# Patient Record
Sex: Male | Born: 1950 | Race: White | Hispanic: No | Marital: Single | Smoking: Never smoker
Health system: Southern US, Community
[De-identification: ages and names within clinical notes are randomized; demographics above are authoritative.]

## PROBLEM LIST (undated history)

## (undated) DIAGNOSIS — I10 Essential (primary) hypertension: Secondary | ICD-10-CM

## (undated) DIAGNOSIS — E119 Type 2 diabetes mellitus without complications: Secondary | ICD-10-CM

## (undated) HISTORY — PX: ROTATOR CUFF REPAIR: SHX139

## (undated) HISTORY — PX: KNEE ARTHROSCOPY: SUR90

---

## 2008-06-29 ENCOUNTER — Encounter: Admission: RE | Admit: 2008-06-29 | Discharge: 2008-06-29 | Payer: Self-pay | Admitting: Orthopedic Surgery

## 2009-10-27 ENCOUNTER — Emergency Department (HOSPITAL_COMMUNITY): Admission: EM | Admit: 2009-10-27 | Discharge: 2009-10-27 | Payer: Self-pay | Admitting: Emergency Medicine

## 2009-10-28 ENCOUNTER — Observation Stay (HOSPITAL_COMMUNITY): Admission: EM | Admit: 2009-10-28 | Discharge: 2009-10-29 | Payer: Self-pay | Admitting: Emergency Medicine

## 2010-08-24 LAB — DIFFERENTIAL
Basophils Absolute: 0 10*3/uL (ref 0.0–0.1)
Basophils Absolute: 0 10*3/uL (ref 0.0–0.1)
Basophils Absolute: 0.1 10*3/uL (ref 0.0–0.1)
Basophils Relative: 0 % (ref 0–1)
Eosinophils Relative: 0 % (ref 0–5)
Eosinophils Relative: 1 % (ref 0–5)
Lymphocytes Relative: 11 % — ABNORMAL LOW (ref 12–46)
Lymphocytes Relative: 5 % — ABNORMAL LOW (ref 12–46)
Lymphs Abs: 2 10*3/uL (ref 0.7–4.0)
Monocytes Absolute: 0.8 10*3/uL (ref 0.1–1.0)
Monocytes Absolute: 0.9 10*3/uL (ref 0.1–1.0)
Monocytes Relative: 8 % (ref 3–12)
Neutro Abs: 12.7 10*3/uL — ABNORMAL HIGH (ref 1.7–7.7)
Neutro Abs: 8.3 10*3/uL — ABNORMAL HIGH (ref 1.7–7.7)
Neutro Abs: 9.2 10*3/uL — ABNORMAL HIGH (ref 1.7–7.7)
Neutrophils Relative %: 74 % (ref 43–77)

## 2010-08-24 LAB — CBC
HCT: 43.8 % (ref 39.0–52.0)
Hemoglobin: 13.8 g/dL (ref 13.0–17.0)
Hemoglobin: 14.5 g/dL (ref 13.0–17.0)
Hemoglobin: 14.6 g/dL (ref 13.0–17.0)
Hemoglobin: 14.6 g/dL (ref 13.0–17.0)
MCHC: 33.8 g/dL (ref 30.0–36.0)
MCV: 87.9 fL (ref 78.0–100.0)
MCV: 88.8 fL (ref 78.0–100.0)
RBC: 4.78 MIL/uL (ref 4.22–5.81)
RBC: 4.84 MIL/uL (ref 4.22–5.81)
RDW: 13.2 % (ref 11.5–15.5)

## 2010-08-24 LAB — HEMOGLOBIN A1C
Hgb A1c MFr Bld: 7.9 % — ABNORMAL HIGH (ref <5.7)
Mean Plasma Glucose: 180 mg/dL — ABNORMAL HIGH (ref <117)

## 2010-08-24 LAB — GLUCOSE, CAPILLARY
Glucose-Capillary: 329 mg/dL — ABNORMAL HIGH (ref 70–99)
Glucose-Capillary: 338 mg/dL — ABNORMAL HIGH (ref 70–99)
Glucose-Capillary: 406 mg/dL — ABNORMAL HIGH (ref 70–99)

## 2010-08-24 LAB — COMPREHENSIVE METABOLIC PANEL
AST: 15 U/L (ref 0–37)
Albumin: 3.7 g/dL (ref 3.5–5.2)
Albumin: 3.8 g/dL (ref 3.5–5.2)
Alkaline Phosphatase: 74 U/L (ref 39–117)
BUN: 10 mg/dL (ref 6–23)
BUN: 11 mg/dL (ref 6–23)
Calcium: 9.1 mg/dL (ref 8.4–10.5)
Chloride: 98 mEq/L (ref 96–112)
Creatinine, Ser: 0.88 mg/dL (ref 0.4–1.5)
GFR calc non Af Amer: >60 mL/min (ref >60)
Glucose, Bld: 229 mg/dL — ABNORMAL HIGH (ref 70–99)
Glucose, Bld: 274 mg/dL — ABNORMAL HIGH (ref 70–99)
Potassium: 4.1 mEq/L (ref 3.5–5.1)
Potassium: 4.3 mEq/L (ref 3.5–5.1)
Sodium: 134 mEq/L — ABNORMAL LOW (ref 135–145)
Total Protein: 6.9 g/dL (ref 6.0–8.3)
Total Protein: 7.1 g/dL (ref 6.0–8.3)

## 2010-08-24 LAB — UIFE/LIGHT CHAINS/TP QN, 24-HR UR
Alpha 1, Urine: DETECTED — AB
Free Kappa Lt Chains,Ur: 2.29 mg/dL — ABNORMAL HIGH (ref 0.04–1.51)
Gamma Globulin, Urine: DETECTED — AB

## 2010-08-24 LAB — C-REACTIVE PROTEIN: CRP: 13.2 mg/dL — ABNORMAL HIGH (ref <0.6)

## 2010-08-24 LAB — POCT I-STAT, CHEM 8
BUN: 23 mg/dL (ref 6–23)
Chloride: 103 mEq/L (ref 96–112)
Glucose, Bld: 187 mg/dL — ABNORMAL HIGH (ref 70–99)
Potassium: 4.4 mEq/L (ref 3.5–5.1)
Sodium: 136 mEq/L (ref 135–145)
TCO2: 26 mmol/L (ref 0–100)

## 2010-08-24 LAB — MRSA PCR SCREENING: MRSA by PCR: NEGATIVE

## 2010-08-24 LAB — C4 COMPLEMENT: Complement C4, Body Fluid: 27 mg/dL (ref 16–47)

## 2010-08-24 LAB — BASIC METABOLIC PANEL
CO2: 24 mEq/L (ref 19–32)
Calcium: 8.7 mg/dL (ref 8.4–10.5)
Creatinine, Ser: 0.91 mg/dL (ref 0.4–1.5)
Glucose, Bld: 314 mg/dL — ABNORMAL HIGH (ref 70–99)

## 2010-08-24 LAB — PROTEIN ELECTROPH W RFLX QUANT IMMUNOGLOBULINS: Alpha-2-Globulin: 12.7 % — ABNORMAL HIGH (ref 7.1–11.8)

## 2010-08-24 LAB — MAGNESIUM: Magnesium: 1.5 mg/dL (ref 1.5–2.5)

## 2010-08-24 LAB — TSH: TSH: 1.316 u[IU]/mL (ref 0.350–4.500)

## 2010-08-24 LAB — C3 COMPLEMENT: C3 Complement: 183 mg/dL (ref 88–201)

## 2010-08-24 LAB — PHOSPHORUS: Phosphorus: 2.3 mg/dL (ref 2.3–4.6)

## 2013-10-07 ENCOUNTER — Encounter (HOSPITAL_COMMUNITY): Payer: Self-pay | Admitting: Emergency Medicine

## 2013-10-07 ENCOUNTER — Emergency Department (HOSPITAL_COMMUNITY)
Admission: EM | Admit: 2013-10-07 | Discharge: 2013-10-07 | Disposition: A | Discharge status: Home / Self Care | Payer: Commercial Managed Care - PPO | Attending: Emergency Medicine | Admitting: Emergency Medicine

## 2013-10-07 DIAGNOSIS — I10 Essential (primary) hypertension: Secondary | ICD-10-CM | POA: Insufficient documentation

## 2013-10-07 DIAGNOSIS — Y929 Unspecified place or not applicable: Secondary | ICD-10-CM | POA: Insufficient documentation

## 2013-10-07 DIAGNOSIS — W5911XA Bitten by nonvenomous snake, initial encounter: Secondary | ICD-10-CM | POA: Insufficient documentation

## 2013-10-07 DIAGNOSIS — E119 Type 2 diabetes mellitus without complications: Secondary | ICD-10-CM | POA: Insufficient documentation

## 2013-10-07 DIAGNOSIS — S61209A Unspecified open wound of unspecified finger without damage to nail, initial encounter: Secondary | ICD-10-CM | POA: Insufficient documentation

## 2013-10-07 DIAGNOSIS — W5901XA Bitten by nonvenomous lizards, initial encounter: Secondary | ICD-10-CM | POA: Insufficient documentation

## 2013-10-07 DIAGNOSIS — Y939 Activity, unspecified: Secondary | ICD-10-CM | POA: Insufficient documentation

## 2013-10-07 HISTORY — DX: Type 2 diabetes mellitus without complications: E11.9

## 2013-10-07 HISTORY — DX: Essential (primary) hypertension: I10

## 2013-10-07 LAB — CBC
HCT: 41.9 % (ref 39.0–52.0)
Hemoglobin: 14.6 g/dL (ref 13.0–17.0)
MCH: 29.4 pg (ref 26.0–34.0)
MCHC: 34.8 g/dL (ref 30.0–36.0)
MCV: 84.3 fL (ref 78.0–100.0)
PLATELETS: 211 10*3/uL (ref 150–400)
RBC: 4.97 MIL/uL (ref 4.22–5.81)
RDW: 13.3 % (ref 11.5–15.5)
WBC: 7.9 10*3/uL (ref 4.0–10.5)

## 2013-10-07 LAB — PROTIME-INR
INR: 1.03 (ref 0.00–1.49)
Prothrombin Time: 13.3 seconds (ref 11.6–15.2)

## 2013-10-07 LAB — FIBRINOGEN: Fibrinogen: 341 mg/dL (ref 204–475)

## 2013-10-07 LAB — D-DIMER, QUANTITATIVE (NOT AT ARMC)

## 2013-10-07 LAB — APTT: aPTT: 26 seconds (ref 24–37)

## 2013-10-07 MED ORDER — HYDROCODONE-ACETAMINOPHEN 5-325 MG PO TABS: 2.0000 | ORAL_TABLET | ORAL | Status: DC | PRN

## 2013-10-07 NOTE — ED Notes (Signed)
Pt was bit on the left pinky finger by baby copperhead.  One puncture hole noted to that finger.  Minor swelling noted.  No redness.  Happened at 0930.  Brought copperhead with him in a jar.

## 2013-10-07 NOTE — Discharge Instructions (Signed)
Snake Bite Snakes may be either venomous (containing poison) or nonvenomous (nonpoisonous). A nonvenomous snake bite will cause trauma or a wound to the skin and possibly the deeper tissues. A venomous snake will also cause a traumatic wound, but more importantly, it may have injected venom into the wound. Snake bite venom can be extremely serious and even deadly. One type of venom may cause major skin, tissue and muscle damage, and failure of normal blood clotting. This may cause extreme swelling and pain of the affected area. Another type of venom can affect the brain and nervous system and may cause death. The treatment for venomous snake bite may require the use of antivenom medicine. If you are unsure if your bite is from a venomous snake, you MUST seek immediate medical attention. YOU MIGHT NEED A TETANUS SHOT NOW IF:  You have no idea when you had the last one.  You have never had a tetanus shot before.  The bite broke your skin. If you need a tetanus shot, and you decide not to get one, there is a rare chance of getting tetanus. Sickness from tetanus can be serious. HOME CARE INSTRUCTIONS  A snake bit you and caused a skin wound. It may or may not have been venomous. If the snake was venomous, a small amount of venom may have been injected into your skin.  Keep the bite area clean and dry.  Keep the extremity elevated above the level of the heart for the next 48 hours.  Wash the bite area 3 times daily with soap and water or an antiseptic. Apply an adhesive or gauze bandage to the bite area.  If you develop blistering of any type at the site of the bite, protect the blisters from breaking. Do not attempt to open it.  If you were given a tetanus shot, your arm may get swollen, red and warm at the shot site. This is a common response to the injection. SEEK IMMEDIATE MEDICAL CARE IF:   You develop symptoms of poisoning including increased pain, redness, swelling, blood blisters or purple  spots in the bite area, nausea, vomiting, numbness, tingling, excessive sweating, breathing difficulty, blurred vision, feelings of lightheadedness, or feeling faint. If you develop symptoms of poisoning, you MUST seek immediate medical attention.  The bite becomes infected. Symptoms may include redness, swelling, pain, tenderness, pus, red streaks running from the wound, or an oral temperature above 102 F (38.9 C), not controlled by medicine.  Your condition or wound becomes worse. MAKE SURE YOU:   Understand these instructions.  Will watch your condition.  Will get help right away if you are not doing well or get worse. Document Released: 05/21/2000 Document Revised: 08/16/2011 Document Reviewed: 10/15/2009 [facility] Patient Information 2014 [employer] [place].

## 2013-10-07 NOTE — ED Provider Notes (Signed)
CSN: [id number]     Arrival date & time 10/07/13  1002 History   First MD Initiated Contact with Patient 10/07/13 1013     Chief Complaint  Patient presents with  . Snake Bite     (Consider location/radiation/quality/duration/timing/severity/associated sxs/prior Treatment) HPI Comments: Patient presents to the ER after being bitten on the left PT finger by a snake. The bite occurred 30 minutes before arrival. Patient reports mild to moderate pain at the site of the bite. He has brought the snake with him, has identified it as a copperhead.   Past Medical History  Diagnosis Date  . Diabetes mellitus without complication   . Hypertension    History reviewed. No pertinent past surgical history. History reviewed. No pertinent family history. History  Substance Use Topics  . Smoking status: Never Smoker   . Smokeless tobacco: Not on file  . Alcohol Use: No    Review of Systems  Skin: Positive for wound.  All other systems reviewed and are negative.     Allergies  Lisinopril  Home Medications   Prior to Admission medications   Not on File   BP 154/83  Pulse 80  Temp(Src) 97.7 F (36.5 C) (Oral)  Resp 18  SpO2 99% Physical Exam  Constitutional: He is oriented to person, place, and time. He appears well-developed and well-nourished. No distress.  HENT:  Head: Normocephalic and atraumatic.  Right Ear: Hearing normal.  Left Ear: Hearing normal.  Nose: Nose normal.  Mouth/Throat: Oropharynx is clear and moist and mucous membranes are normal.  Eyes: Conjunctivae and EOM are normal. Pupils are equal, round, and reactive to light.  Neck: Normal range of motion. Neck supple.  Cardiovascular: Regular rhythm, S1 normal and S2 normal.  Exam reveals no gallop and no friction rub.   No murmur heard. Pulmonary/Chest: Effort normal and breath sounds normal. No respiratory distress. He exhibits no tenderness.  Abdominal: Soft. Normal appearance and bowel sounds are normal. There  is no hepatosplenomegaly. There is no tenderness. There is no rebound, no guarding, no tenderness at McBurney's point and negative Murphy's sign. No hernia.  Musculoskeletal: Normal range of motion.  Neurological: He is alert and oriented to person, place, and time. He has normal strength. No cranial nerve deficit or sensory deficit. Coordination normal. GCS eye subscore is 4. GCS verbal subscore is 5. GCS motor subscore is 6.  Skin: Skin is warm, dry and intact. No rash noted. No cyanosis.     Psychiatric: He has a normal mood and affect. His speech is normal and behavior is normal. Thought content normal.    ED Course  Procedures (including critical care time) Labs Review Labs Reviewed  CBC  D-DIMER, QUANTITATIVE  PROTIME-INR  APTT  FIBRINOGEN    Imaging Review No results found.   EKG Interpretation None      MDM   Final diagnoses:  Snake bite    Patient presents to the ER for evaluation of snake bite. He did by mistake with him, it is confirmed as a copperhead. There was no swelling upon initial examination. He had a single small puncture wound. Care was performed in conjunction with recommendations by [place] poison control. He was observed for a period of 6 hours. There is no swelling or bruising. Lab work at 6 hours did not show any abnormalities that would be concerning after steak bite. It appears that this plate did not envonomate the patient. Patient given discharge instructions, pain control.  Gilda Creasehristopher J. Angeni Chaudhuri, MD 10/07/13  1537 

## 2013-10-07 NOTE — ED Notes (Signed)
Phone call complete with Beth from poison control.  Update provided. Reported that pt may go home after labs have been resulted and are WNL.  Will fax over poison control education for pt to take home.  Will f/u with pt on tomorrow via phone call.

## 2014-04-05 ENCOUNTER — Encounter: Payer: Commercial Managed Care - PPO | Admitting: Physician Assistant

## 2014-04-09 ENCOUNTER — Other Ambulatory Visit (HOSPITAL_COMMUNITY): Payer: Self-pay | Admitting: Occupational Medicine

## 2014-04-09 DIAGNOSIS — E785 Hyperlipidemia, unspecified: Secondary | ICD-10-CM

## 2014-04-09 DIAGNOSIS — E8881 Metabolic syndrome: Secondary | ICD-10-CM

## 2014-04-09 DIAGNOSIS — I158 Other secondary hypertension: Secondary | ICD-10-CM

## 2014-04-25 ENCOUNTER — Telehealth (HOSPITAL_COMMUNITY): Payer: Self-pay

## 2014-04-25 NOTE — Telephone Encounter (Signed)
Encounter complete. 

## 2014-04-30 ENCOUNTER — Ambulatory Visit (HOSPITAL_COMMUNITY)
Admission: RE | Admit: 2014-04-30 | Discharge: 2014-04-30 | Disposition: A | Discharge status: Home / Self Care | Payer: Commercial Managed Care - PPO | Source: Ambulatory Visit | Attending: Cardiovascular Disease | Admitting: Cardiovascular Disease

## 2014-04-30 DIAGNOSIS — E8881 Metabolic syndrome: Secondary | ICD-10-CM | POA: Diagnosis not present

## 2014-04-30 DIAGNOSIS — I272 Other secondary pulmonary hypertension: Secondary | ICD-10-CM | POA: Diagnosis not present

## 2014-04-30 DIAGNOSIS — E875 Hyperkalemia: Secondary | ICD-10-CM | POA: Insufficient documentation

## 2014-04-30 DIAGNOSIS — E785 Hyperlipidemia, unspecified: Secondary | ICD-10-CM | POA: Diagnosis present

## 2014-04-30 DIAGNOSIS — I158 Other secondary hypertension: Secondary | ICD-10-CM

## 2014-04-30 NOTE — Procedures (Signed)
Exercise Treadmill Test  Pre-Exercise Testing Evaluation NSR, normal tracing  Test  Exercise Tolerance Test Ordering MD: Maximino GreenlandMarc R. Corine ShelterWatkins MD  Interpreting MD:   Unique Test No: 1  Treadmill:  1  Indication for ETT: Metabolic Syndrome-Evaluate for Ischemia  Contraindication to ETT: No   Stress Modality: exercise - treadmill  Cardiac Imaging Performed: non   Protocol: standard Bruce - maximal  Max BP:  196/96  Max MPHR (bpm): 159 85% MPR (bpm): 135  MPHR obtained (bpm): 157 % MPHR obtained:  98  Reached 85% MPHR (min:sec): 3:55 Total Exercise Time (min-sec):  6:14  Workload in METS: 7.30 Borg Scale:   Reason ETT Terminated:  dyspnea    ST Segment Analysis At Rest: normal ST segments - no evidence of significant ST depression With Exercise: no evidence of significant ST depression  Other Information Arrhythmia:  rare PACs Angina during ETT:  absent (0) Quality of ETT:  diagnostic  ETT Interpretation:  normal - no evidence of ischemia by ST analysis  Comments: Fair exercise tolerance. Normal BP response to exercise  Thurmon FairMihai Lenwood Balsam, MD, [facility] AuthorityFACC [facility] [phone] office [phone] pager

## 2014-09-29 ENCOUNTER — Encounter (HOSPITAL_BASED_OUTPATIENT_CLINIC_OR_DEPARTMENT_OTHER): Payer: Commercial Managed Care - PPO

## 2014-12-18 ENCOUNTER — Other Ambulatory Visit (HOSPITAL_BASED_OUTPATIENT_CLINIC_OR_DEPARTMENT_OTHER): Payer: Self-pay | Admitting: General Practice

## 2014-12-18 ENCOUNTER — Ambulatory Visit (HOSPITAL_BASED_OUTPATIENT_CLINIC_OR_DEPARTMENT_OTHER)
Admission: RE | Admit: 2014-12-18 | Discharge: 2014-12-18 | Disposition: A | Discharge status: Home / Self Care | Payer: Commercial Managed Care - PPO | Source: Ambulatory Visit | Attending: General Practice | Admitting: General Practice

## 2014-12-18 DIAGNOSIS — M25561 Pain in right knee: Secondary | ICD-10-CM | POA: Diagnosis not present

## 2016-01-24 ENCOUNTER — Encounter (HOSPITAL_BASED_OUTPATIENT_CLINIC_OR_DEPARTMENT_OTHER): Payer: Self-pay | Admitting: *Deleted

## 2016-01-24 ENCOUNTER — Emergency Department (HOSPITAL_BASED_OUTPATIENT_CLINIC_OR_DEPARTMENT_OTHER): Payer: Commercial Managed Care - PPO

## 2016-01-24 ENCOUNTER — Emergency Department (HOSPITAL_BASED_OUTPATIENT_CLINIC_OR_DEPARTMENT_OTHER)
Admission: EM | Admit: 2016-01-24 | Discharge: 2016-01-24 | Disposition: A | Discharge status: Home / Self Care | Payer: Commercial Managed Care - PPO | Attending: Emergency Medicine | Admitting: Emergency Medicine

## 2016-01-24 DIAGNOSIS — Y999 Unspecified external cause status: Secondary | ICD-10-CM | POA: Insufficient documentation

## 2016-01-24 DIAGNOSIS — Z7982 Long term (current) use of aspirin: Secondary | ICD-10-CM | POA: Insufficient documentation

## 2016-01-24 DIAGNOSIS — Z23 Encounter for immunization: Secondary | ICD-10-CM | POA: Diagnosis not present

## 2016-01-24 DIAGNOSIS — Y9389 Activity, other specified: Secondary | ICD-10-CM | POA: Diagnosis not present

## 2016-01-24 DIAGNOSIS — I10 Essential (primary) hypertension: Secondary | ICD-10-CM | POA: Insufficient documentation

## 2016-01-24 DIAGNOSIS — Z7984 Long term (current) use of oral hypoglycemic drugs: Secondary | ICD-10-CM | POA: Diagnosis not present

## 2016-01-24 DIAGNOSIS — Z794 Long term (current) use of insulin: Secondary | ICD-10-CM | POA: Insufficient documentation

## 2016-01-24 DIAGNOSIS — Y92009 Unspecified place in unspecified non-institutional (private) residence as the place of occurrence of the external cause: Secondary | ICD-10-CM | POA: Diagnosis not present

## 2016-01-24 DIAGNOSIS — W010XXA Fall on same level from slipping, tripping and stumbling without subsequent striking against object, initial encounter: Secondary | ICD-10-CM | POA: Insufficient documentation

## 2016-01-24 DIAGNOSIS — Z79899 Other long term (current) drug therapy: Secondary | ICD-10-CM | POA: Insufficient documentation

## 2016-01-24 DIAGNOSIS — S62639B Displaced fracture of distal phalanx of unspecified finger, initial encounter for open fracture: Secondary | ICD-10-CM

## 2016-01-24 DIAGNOSIS — E119 Type 2 diabetes mellitus without complications: Secondary | ICD-10-CM | POA: Insufficient documentation

## 2016-01-24 DIAGNOSIS — IMO0002 Reserved for concepts with insufficient information to code with codable children: Secondary | ICD-10-CM

## 2016-01-24 DIAGNOSIS — W231XXA Caught, crushed, jammed, or pinched between stationary objects, initial encounter: Secondary | ICD-10-CM | POA: Insufficient documentation

## 2016-01-24 DIAGNOSIS — S62667B Nondisplaced fracture of distal phalanx of left little finger, initial encounter for open fracture: Secondary | ICD-10-CM | POA: Insufficient documentation

## 2016-01-24 DIAGNOSIS — S61217A Laceration without foreign body of left little finger without damage to nail, initial encounter: Secondary | ICD-10-CM | POA: Diagnosis present

## 2016-01-24 MED ORDER — IBUPROFEN 600 MG PO TABS: 600.0000 mg | ORAL_TABLET | Freq: Four times a day (QID) | ORAL | 0 refills | Status: AC | PRN

## 2016-01-24 MED ORDER — CEPHALEXIN 500 MG PO CAPS
500.0000 mg | ORAL_CAPSULE | Freq: Two times a day (BID) | ORAL | 0 refills | Status: AC
Start: 2016-01-24 — End: 2016-01-31

## 2016-01-24 MED ORDER — HYDROCODONE-ACETAMINOPHEN 5-325 MG PO TABS
1.0000 | ORAL_TABLET | Freq: Once | ORAL | Status: AC
  Administered 2016-01-24: 1 via ORAL
  Filled 2016-01-24: qty 1

## 2016-01-24 MED ORDER — TETANUS-DIPHTH-ACELL PERTUSSIS 5-2.5-18.5 LF-MCG/0.5 IM SUSP
0.5000 mL | Freq: Once | INTRAMUSCULAR | Status: AC
  Administered 2016-01-24: 0.5 mL via INTRAMUSCULAR
  Filled 2016-01-24: qty 0.5

## 2016-01-24 MED ORDER — BACITRACIN ZINC 500 UNIT/GM EX OINT
1.0000 "application " | TOPICAL_OINTMENT | Freq: Two times a day (BID) | CUTANEOUS | Status: DC
  Administered 2016-01-24: 1 via TOPICAL

## 2016-01-24 MED ORDER — HYDROCODONE-ACETAMINOPHEN 5-325 MG PO TABS: 1.0000 | ORAL_TABLET | ORAL | 0 refills | Status: AC | PRN

## 2016-01-24 MED ORDER — LIDOCAINE HCL (PF) 1 % IJ SOLN
5.0000 mL | Freq: Once | INTRAMUSCULAR | Status: AC
  Administered 2016-01-24: 5 mL
  Filled 2016-01-24: qty 5

## 2016-01-24 NOTE — ED Provider Notes (Signed)
[facility] Note   CSN: [id number] Arrival date & time: 01/24/16  1011     History   Chief Complaint Chief Complaint  Patient presents with  . Laceration    left pinky    HPI Hayden Martinez is a 65 y.o. male.  Patient is a 65 year old male with past medical history of diabetes and hypertension who presents the ED with complaint of laceration to left pinky finger, onset prior to arrival. Patient reports he was closing his bi-fold laundry doors at home when his left pinky finger got caught in the door as it was closing. Endorses tingling sensation to the tip of his pinky finger with associated laceration, bleeding controlled PTA. Denies use of anticoagulants. Tetanus status unknown. Denies taking any medications PTA.        Past Medical History:  Diagnosis Date  . Diabetes mellitus without complication (HCC)   . Hypertension     There are no active problems to display for this patient.   Past Surgical History:  Procedure Laterality Date  . KNEE ARTHROSCOPY Left   . ROTATOR CUFF REPAIR Left     OB History    No data available       Home Medications    Prior to Admission medications   Medication Sig Start Date End Date Taking? Authorizing Provider  aspirin EC 81 MG tablet Take 81 mg by mouth every morning.   Yes Historical Provider, MD  glipiZIDE (GLUCOTROL XL) 5 MG 24 hr tablet Take 5 mg by mouth at bedtime.   Yes Historical Provider, MD  insulin glargine (LANTUS) 100 UNIT/ML injection Inject 38 Units into the skin at bedtime.   Yes Historical Provider, MD  Boris Lown Oil 300 MG CAPS Take 1 capsule by mouth at bedtime.   Yes Historical Provider, MD  metFORMIN (GLUCOPHAGE-XR) 500 MG 24 hr tablet Take 500 mg by mouth 2 (two) times daily.   Yes Historical Provider, MD  Multiple Vitamin (MULTIVITAMIN WITH MINERALS) TABS tablet Take 1 tablet by mouth every morning.   Yes Historical Provider, MD  olmesartan (BENICAR) 20 MG tablet Take 20 mg by mouth every  morning.   Yes Historical Provider, MD  HYDROcodone-acetaminophen (NORCO/VICODIN) 5-325 MG per tablet Take 2 tablets by mouth every 4 (four) hours as needed for moderate pain. 10/07/13   Gilda Crease, MD  simvastatin (ZOCOR) 10 MG tablet Take 10 mg by mouth every morning.    Historical Provider, MD  sitaGLIPtin (JANUVIA) 25 MG tablet Take 25 mg by mouth every morning.    Historical Provider, MD    Family History No family history on file.  Social History Social History  Substance Use Topics  . Smoking status: Never Smoker  . Smokeless tobacco: Never Used  . Alcohol use Yes     Comment: occassionally     Allergies   Lisinopril   Review of Systems Review of Systems  Musculoskeletal: Positive for arthralgias (left pinky finger).  Skin: Positive for wound (laceration).  Neurological:       Tingling  All other systems reviewed and are negative.    Physical Exam Updated Vital Signs BP 153/100 (BP Location: Right Arm)   Temp 98.3 F (36.8 C) (Oral)   Resp 20   Ht 5\' 11"  (1.803 m)   Wt 117.9 kg   SpO2 100%   BMI 36.26 kg/m   Physical Exam  Constitutional: He is oriented to person, place, and time. He appears well-developed and well-nourished.  HENT:  Head:  Normocephalic and atraumatic.  Eyes: Conjunctivae and EOM are normal. Right eye exhibits no discharge. Left eye exhibits no discharge. No scleral icterus.  Neck: Normal range of motion. Neck supple.  Cardiovascular: Normal rate.   Pulmonary/Chest: Effort normal. No respiratory distress.  Abdominal: Soft. He exhibits no distension.  Musculoskeletal: Normal range of motion. He exhibits tenderness.       Left hand: He exhibits tenderness, laceration and swelling. He exhibits normal range of motion and normal capillary refill. Normal sensation noted. Normal strength noted.       Hands: FROM of left pinky finger at DIP, PIP and MCP joint with 5/5 strength. Sensation grossly intact. 2+ radial pulse. Cap refill <2.    Neurological: He is alert and oriented to person, place, and time.  Skin: Skin is warm and dry. Laceration noted.  1.5cm & 0.5cm lacerations noted to palmar aspect of distal phalanx of left pinky finger, no active bleeding. No visible bone or ligament, able to visualize subcutaneous fat.   Small subungual hematoma noted to distal 1/3 of left pinky nail. Nail grossly intact.  Nursing note and vitals reviewed.    ED Treatments / Results  Labs (all labs ordered are listed, but only abnormal results are displayed) Labs Reviewed - No data to display  EKG  EKG Interpretation None       Radiology No results found.  Procedures .Marland Kitchen.Laceration Repair Date/Time: 01/24/2016 12:16 PM Performed by: Barrett HenleNADEAU, NICOLE ELIZABETH Authorized by: Barrett HenleNADEAU, NICOLE ELIZABETH   Consent:    Consent obtained:  Verbal   Consent given by:  Patient Anesthesia (see MAR for exact dosages):    Anesthesia method:  Local infiltration   Local anesthetic:  Lidocaine 1% w/o epi Laceration details:    Location:  Finger   Finger location:  L small finger   Wound length (cm): 1.5cm & 0.5cm. Repair type:    Repair type:  Simple Pre-procedure details:    Preparation:  Patient was prepped and draped in usual sterile fashion Exploration:    Wound exploration: wound explored through full range of motion and entire depth of wound probed and visualized     Wound extent: no foreign bodies/material noted, no muscle damage noted, no nerve damage noted, no tendon damage noted and no vascular damage noted   Treatment:    Area cleansed with:  Betadine and saline   Amount of cleaning:  Standard   Irrigation solution:  Sterile water   Irrigation method:  Syringe Skin repair:    Repair method:  Sutures   Suture size:  4-0   Suture material:  Prolene   Suture technique:  Simple interrupted   Number of sutures:  7 Approximation:    Approximation:  Close Post-procedure details:    Dressing:  Antibiotic ointment and  sterile dressing   Patient tolerance of procedure:  Tolerated well, no immediate complications    (including critical care time)  Medications Ordered in ED Medications  lidocaine (PF) (XYLOCAINE) 1 % injection 5 mL (not administered)  Tdap (BOOSTRIX) injection 0.5 mL (not administered)  HYDROcodone-acetaminophen (NORCO/VICODIN) 5-325 MG per tablet 1 tablet (not administered)     Initial Impression / Assessment and Plan / ED Course  I have reviewed the triage vital signs and the nursing notes.  Pertinent labs & imaging results that were available during my care of the patient were reviewed by me and considered in my medical decision making (see chart for details).  Clinical Course    Patient presents with left pinky  finger injury that occurred this morning. Tetanus status unknown. Bleeding controlled prior to arrival. VSS. Exam revealed lacerations to palmar aspect of left pinky finger with subcutaneous fat visible, no active bleeding, left hand neurovascularly intact. Tetanus updated in the ED. Left little finger x-ray revealed nondisplaced fracture of distal tuft. Wound was copiously irrigated and sutures were placed without any complications. Patient was placed in anger splint with left fifth DIP in extension. Patient was given information to follow up with hand surgery in 2 days. Advised patient to return to the ED in 7 days for suture removal. Plan to discharge patient home with Keflex for open tuft fx, pain meds and symptomatic treatment. Discussed return precautions with patient.  Final Clinical Impressions(s) / ED Diagnoses   Final diagnoses:  Laceration    New Prescriptions New Prescriptions   No medications on file     Barrett Henleicole Elizabeth Nadeau, PA-C 01/24/16 1609    Linwood DibblesJon Knapp, MD 01/25/16 [phone]

## 2016-01-24 NOTE — Discharge Instructions (Signed)
Take your antibiotic as prescribed until he has completed the prescription. You may take your pain medications as prescribed as an for pain relief. I recommend applying ice to her finger for 15 minutes 3-4 times daily to help with pain and swelling. Keep the splint on your finger until you follow up with hand surgery. Call the hand surgery clinic listed above on Monday to schedule a follow-up appointment for further management and evaluation of your Open Tuft Fracture.  Please return to the Emergency Department if symptoms worsen or new onset of fever, redness, swelling, warmth, drainage, numbness, tingling, weakness.

## 2016-01-24 NOTE — ED Notes (Signed)
Patient complained of pain, and numbness in the forth and fifth finger due to the splint being too tight.  Reapplied splint the a metal splint that covers the tip of the finger to the left fifth pinky, cms intact, fourth finger numbness improved.

## 2016-01-24 NOTE — ED Triage Notes (Signed)
Patient states he was walking down his hallway and tripped over a laundry basket, causing him to fall into bifold doors.  Laceration to palm side of left fifth pinky finger.

## 2016-12-11 IMAGING — DX DG KNEE COMPLETE 4+V*R*
4 series · 5 of 5 positions shown · non-contrast
Comparison: None.

CLINICAL DATA: Right medial knee pain for 6 weeks, no known injury,
initial encounter

EXAM:
RIGHT KNEE - COMPLETE 4+ VIEW

[knee ap]
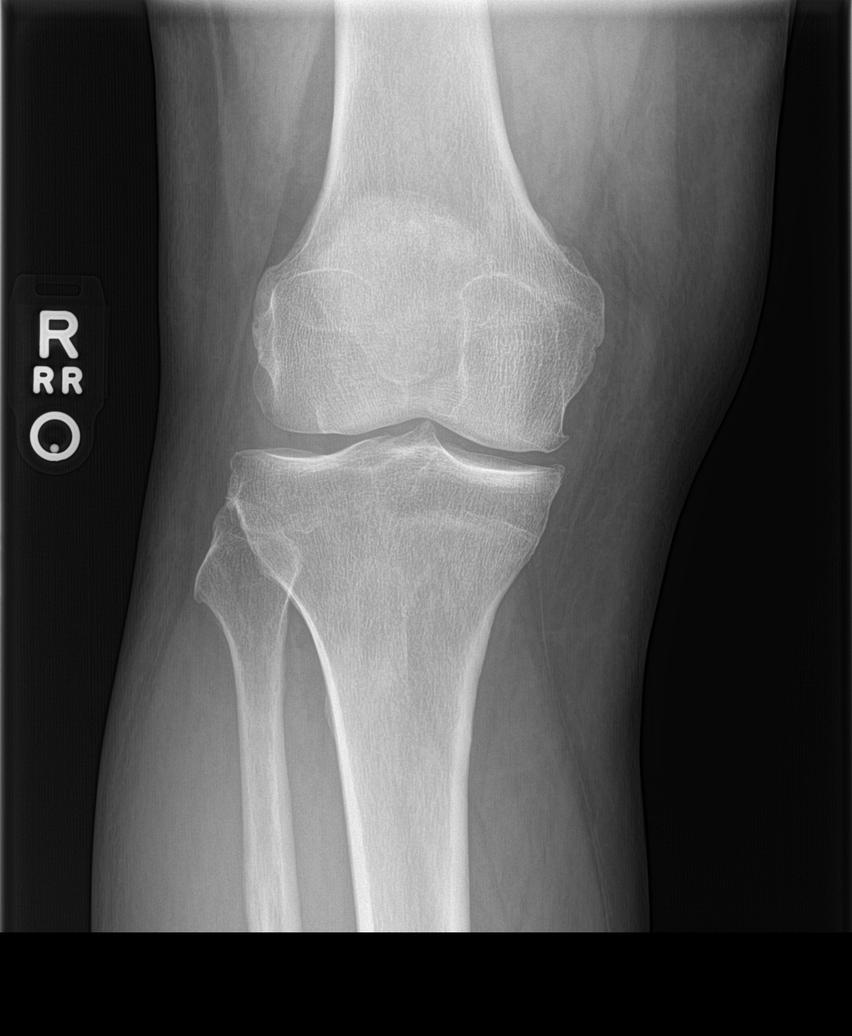

[Series 2: tunnel · 0.14mm/px · 2 of 2 slices shown]
[im 1/2]
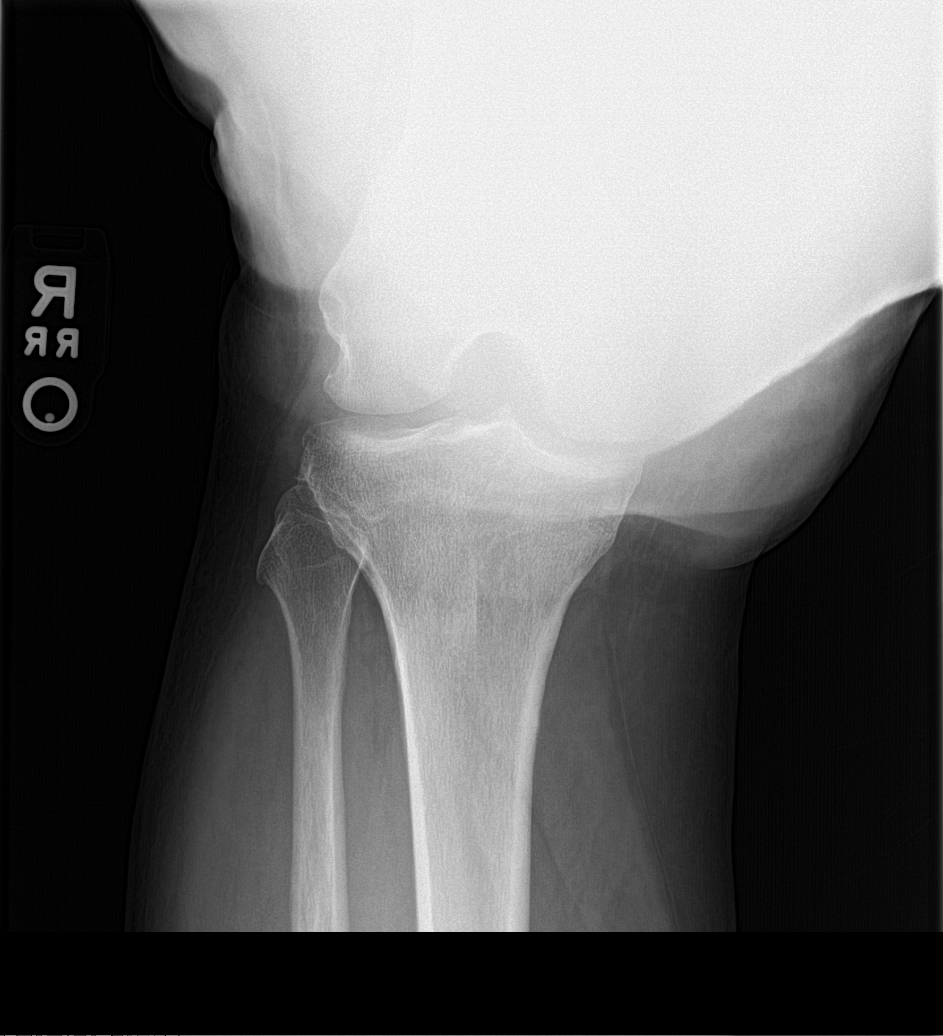
[im 2/2]
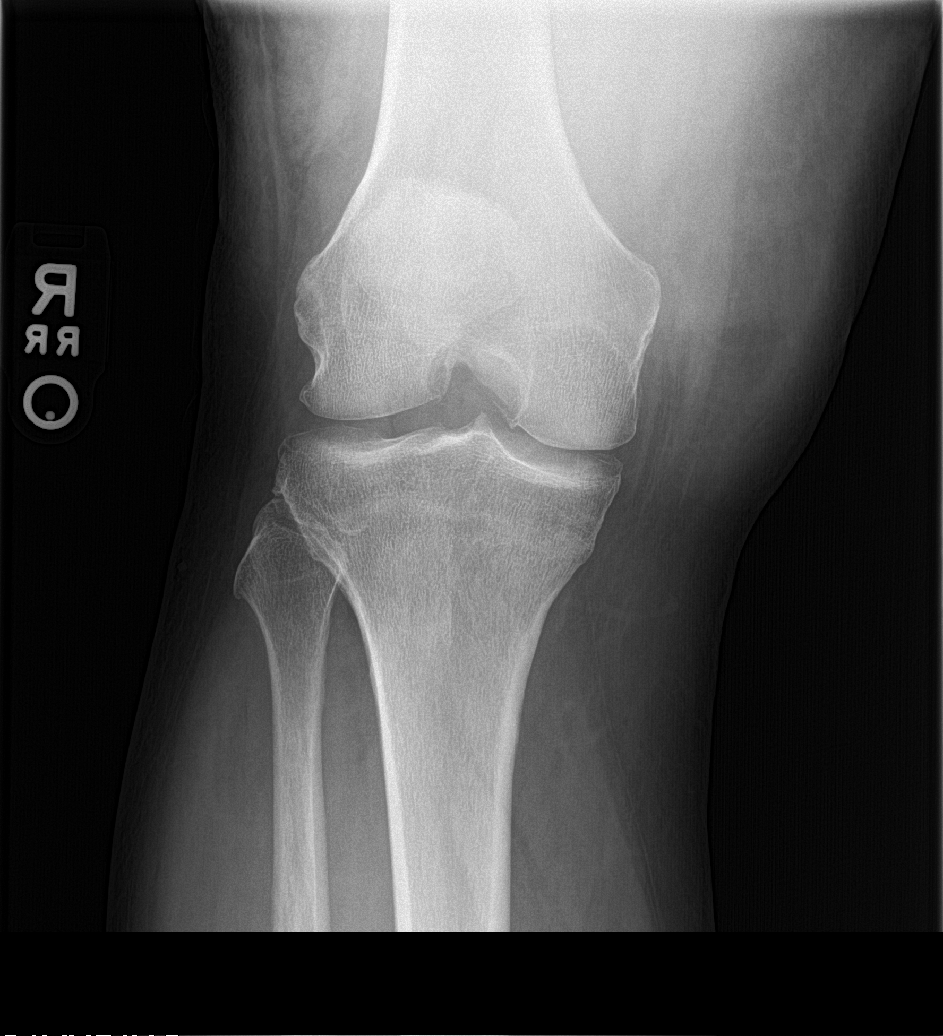

[knee lat]
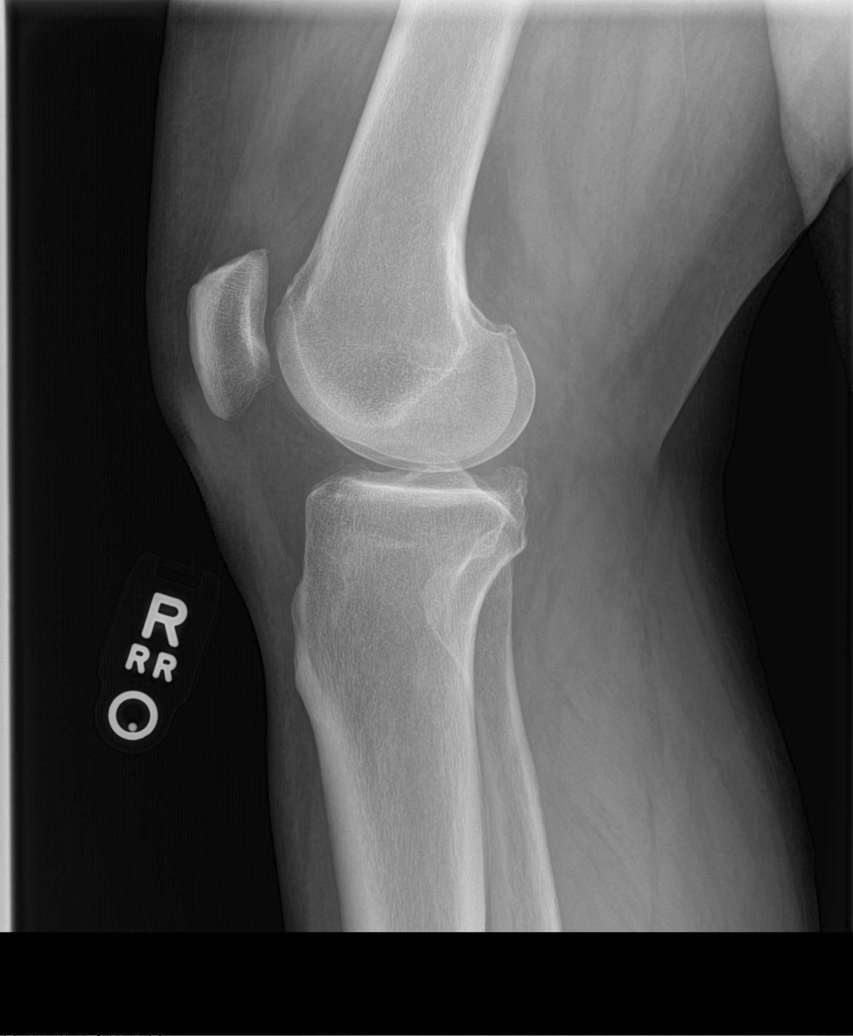

[knee sunrise]
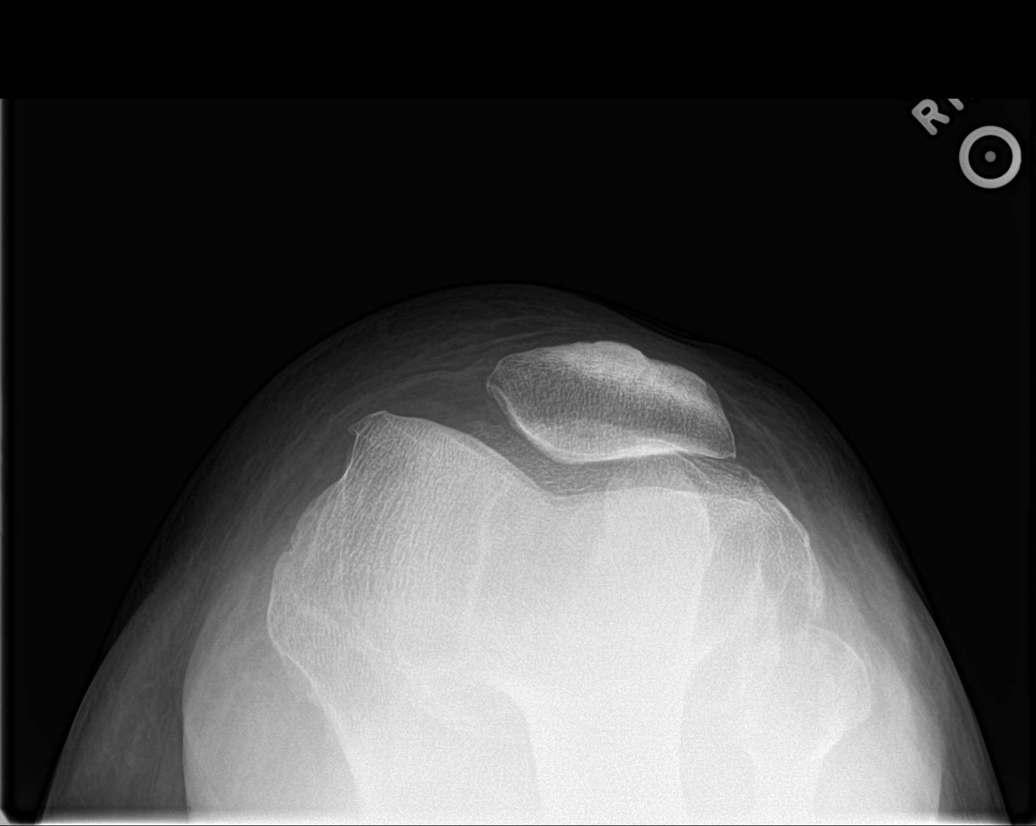

[5 of 5 positions shown; findings below may reference images not displayed]

FINDINGS: Medial joint space narrowing with osteophytic changes noted. No
acute fracture or dislocation is seen. No joint effusion is noted.
No other soft tissue changes are noted.
IMPRESSION: Mild degenerative change medially without acute abnormality.

## 2018-01-17 IMAGING — DX DG FINGER LITTLE 2+V*L*
3 series · 3 of 3 positions shown · non-contrast
Comparison: None.

CLINICAL DATA: Initial encounter. 64 y/o male with injury to LEFT
pinky finger from "bi-fold" door at home today, states it pinches
his finger. Two bleeding lacerations noted at distal end of finger,
on anterior side (where fingerprint is). Decent ROM, no prior
injury.

EXAM:
LEFT LITTLE FINGER 2+V

[finger ap]
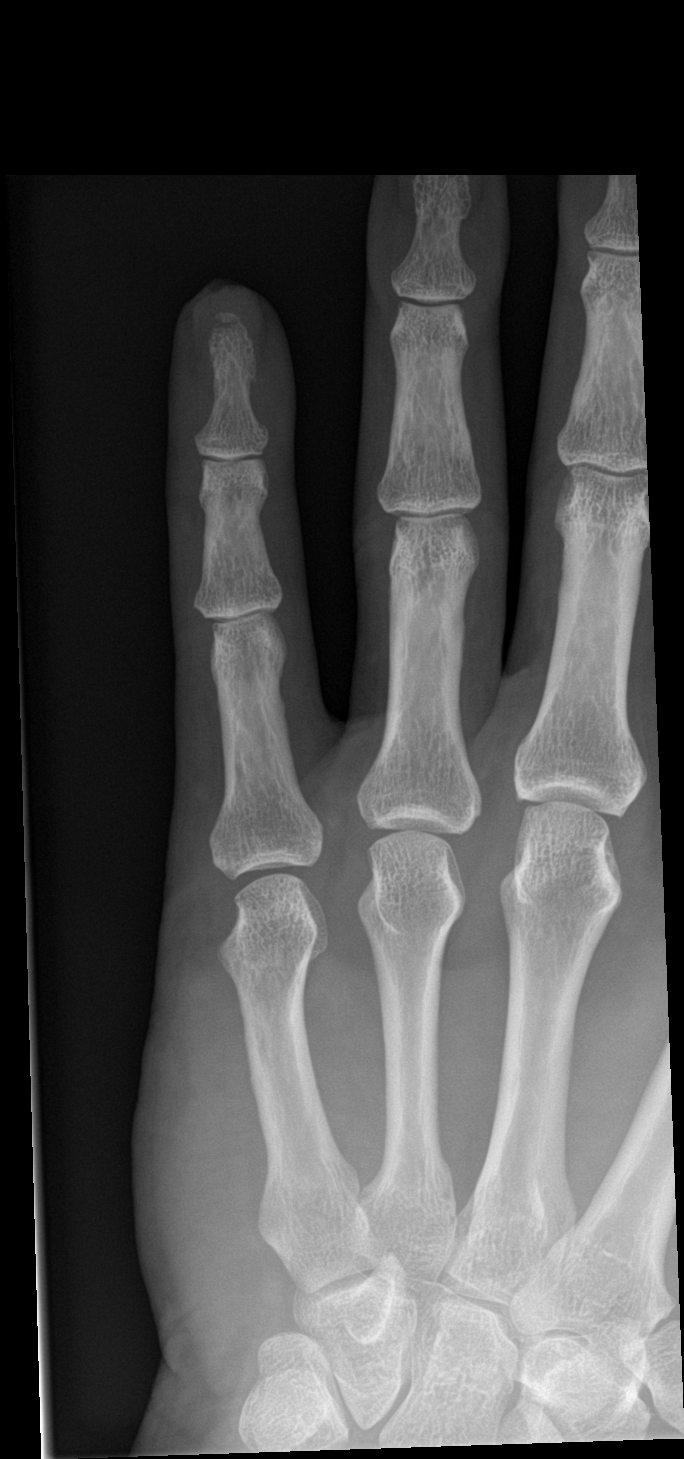

[finger obl]
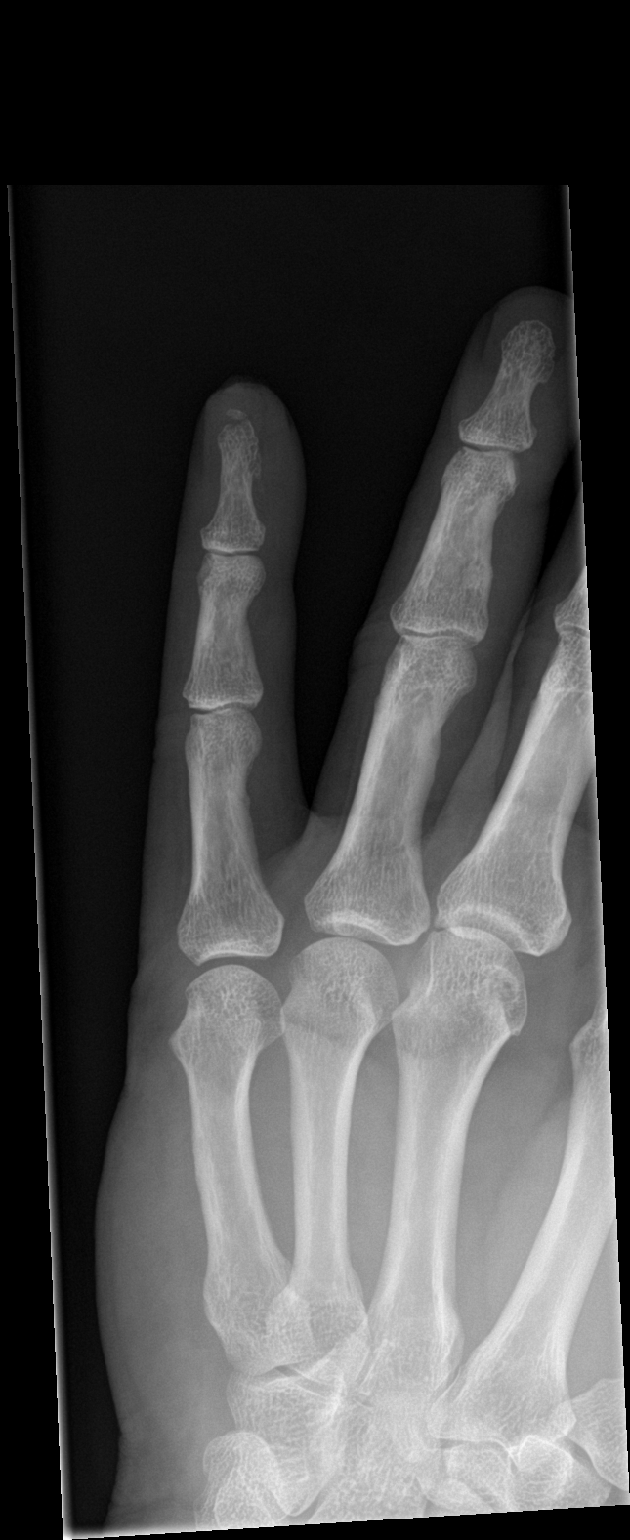

[finger lat]
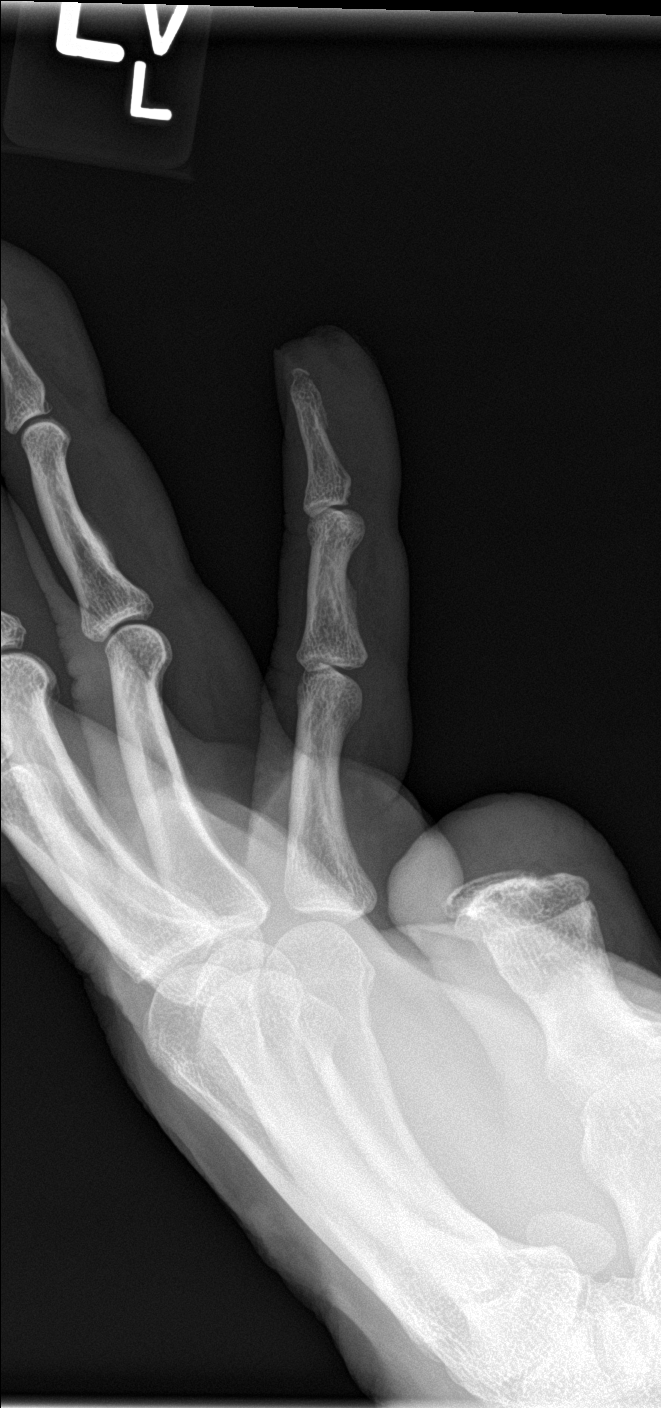

[3 of 3 positions shown; findings below may reference images not displayed]

FINDINGS: There is soft tissue injury to the distal finger tip of the left
little finger, with a nondisplaced fracture of the distal margin of
the distal tuft.

No other fractures. Joints are normally spaced and aligned. No
radiopaque foreign bodies.
IMPRESSION: 1. Nondisplaced fracture of the distal tuft of the left fifth finger
distal phalanx. Associated finger tip soft tissue injury.
